# Patient Record
Sex: Male | Born: 1990 | Race: Black or African American | Hispanic: No | Marital: Single | Smoking: Never smoker
Health system: Southern US, Community
[De-identification: ages and names within clinical notes are randomized; demographics above are authoritative.]

## PROBLEM LIST (undated history)

## (undated) DIAGNOSIS — L309 Dermatitis, unspecified: Secondary | ICD-10-CM

## (undated) HISTORY — PX: SINUS SURGERY WITH INSTATRAK: SHX5215

---

## 2014-09-27 ENCOUNTER — Encounter (HOSPITAL_COMMUNITY): Payer: Self-pay | Admitting: Emergency Medicine

## 2014-09-27 ENCOUNTER — Emergency Department (INDEPENDENT_AMBULATORY_CARE_PROVIDER_SITE_OTHER)
Admission: EM | Admit: 2014-09-27 | Discharge: 2014-09-27 | Disposition: A | Discharge status: Home / Self Care | Payer: BLUE CROSS/BLUE SHIELD | Source: Home / Self Care | Attending: Family Medicine | Admitting: Family Medicine

## 2014-09-27 ENCOUNTER — Emergency Department (INDEPENDENT_AMBULATORY_CARE_PROVIDER_SITE_OTHER): Payer: BLUE CROSS/BLUE SHIELD

## 2014-09-27 DIAGNOSIS — S62609A Fracture of unspecified phalanx of unspecified finger, initial encounter for closed fracture: Secondary | ICD-10-CM

## 2014-09-27 MED ORDER — HYDROCODONE-ACETAMINOPHEN 5-325 MG PO TABS: 1.0000 | ORAL_TABLET | Freq: Four times a day (QID) | ORAL | Status: DC | PRN

## 2014-09-27 NOTE — ED Notes (Signed)
Pt states that he was playing basketball and he finger was bent back. Pt index finger is swollen.

## 2014-09-27 NOTE — ED Provider Notes (Signed)
CSN: [id number]     Arrival date & time 09/27/14  1452 History   First MD Initiated Contact with Patient 09/27/14 1547     Chief Complaint  Patient presents with  . Finger Injury   (Consider location/radiation/quality/duration/timing/severity/associated sxs/prior Treatment) Patient is a 24 y.o. male presenting with hand pain. The history is provided by the patient.  Hand Pain This is a new problem. The current episode started 3 to 5 hours ago (injured playing basketball 3 wks ago, hyperextended again today playing ball .).    History reviewed. No pertinent past medical history. History reviewed. No pertinent past surgical history. History reviewed. No pertinent family history. History  Substance Use Topics  . Smoking status: Never Smoker   . Smokeless tobacco: Not on file  . Alcohol Use: Yes     Comment: pt states once a week    Review of Systems  Musculoskeletal: Positive for joint swelling.  Skin: Negative.     Allergies  Review of patient's allergies indicates no known allergies.  Home Medications   Prior to Admission medications   Medication Sig Start Date End Date Taking? Authorizing Provider  HYDROcodone-acetaminophen (NORCO/VICODIN) 5-325 MG per tablet Take 1 tablet by mouth every 6 (six) hours as needed for moderate pain or severe pain. 09/27/14   Linna HoffJames D Katharine Rochefort, MD   BP 141/85 mmHg  Pulse 81  Temp(Src) 98.4 F (36.9 C) (Oral)  Resp 18  Ht 6' (1.829 m)  Wt 180 lb (81.647 kg)  BMI 24.41 kg/m2  SpO2 100% Physical Exam  Constitutional: He is oriented to person, place, and time. He appears well-developed and well-nourished.  Musculoskeletal: He exhibits tenderness.       Hands: Neurological: He is alert and oriented to person, place, and time.  Skin: Skin is warm and dry.  Nursing note and vitals reviewed.   ED Course  Procedures (including critical care time) Labs Review Labs Reviewed - No data to display  Imaging Review Dg Finger Index  Right  09/27/2014   CLINICAL DATA:  Injury of the right index finger while playing basketball today.  EXAM: RIGHT INDEX FINGER 2+V  COMPARISON:  None.  FINDINGS: The patient has sustained a mildly impacted fracture of the base of the proximal phalanx of the index finger. In addition there is an obliquely oriented intra-articular fracture through the radial aspect of the distal portion of the proximal phalanx. A small avulsion fracture fragment is noted along the ulnar aspect of the PIP joint space. The middle and distal phalanges appear intact. The joint spaces are preserved. The observed portions of the first metacarpal are intact.  IMPRESSION: The patient has sustained fractures of the proximal and distal portions of the proximal phalanx of the index finger. The distal fracture is intra-articular.   Electronically Signed   By: David  SwazilandJordan   On: 09/27/2014 16:37     MDM   1. Finger fracture, right, closed, initial encounter    Discussed with dr Melvyn Novasortmann, plans as advised.   Linna HoffJames D Shonika Kolasinski, MD 09/28/14 [phone]

## 2014-09-27 NOTE — Discharge Instructions (Signed)
Leave splinted until seen by orthopedist on thurs.

## 2014-09-29 ENCOUNTER — Encounter (HOSPITAL_COMMUNITY): Payer: Self-pay | Admitting: *Deleted

## 2014-09-29 NOTE — Progress Notes (Signed)
Called Dr. Glenna Durandrtmann's office for pre-op orders. Left message on his schedulers voicemail.

## 2014-09-30 ENCOUNTER — Ambulatory Visit (HOSPITAL_COMMUNITY): Payer: BLUE CROSS/BLUE SHIELD | Admitting: Anesthesiology

## 2014-09-30 ENCOUNTER — Ambulatory Visit (HOSPITAL_COMMUNITY)
Admission: RE | Admit: 2014-09-30 | Discharge: 2014-09-30 | Disposition: A | Discharge status: Home / Self Care | Payer: BLUE CROSS/BLUE SHIELD | Source: Ambulatory Visit | Attending: Orthopedic Surgery | Admitting: Orthopedic Surgery

## 2014-09-30 ENCOUNTER — Encounter (HOSPITAL_COMMUNITY)
Admission: RE | Disposition: A | Discharge status: Home / Self Care | Payer: Self-pay | Source: Ambulatory Visit | Attending: Orthopedic Surgery

## 2014-09-30 ENCOUNTER — Encounter (HOSPITAL_COMMUNITY): Payer: Self-pay | Admitting: *Deleted

## 2014-09-30 DIAGNOSIS — S62640A Nondisplaced fracture of proximal phalanx of right index finger, initial encounter for closed fracture: Secondary | ICD-10-CM | POA: Insufficient documentation

## 2014-09-30 DIAGNOSIS — X58XXXA Exposure to other specified factors, initial encounter: Secondary | ICD-10-CM | POA: Insufficient documentation

## 2014-09-30 HISTORY — PX: OPEN REDUCTION INTERNAL FIXATION (ORIF) FINGER WITH RADIAL BONE GRAFT: SHX5666

## 2014-09-30 HISTORY — DX: Dermatitis, unspecified: L30.9

## 2014-09-30 SURGERY — OPEN REDUCTION INTERNAL FIXATION (ORIF) FINGER WITH RADIAL BONE GRAFT
Anesthesia: General | Site: Hand | Laterality: Right

## 2014-09-30 MED ORDER — CHLORHEXIDINE GLUCONATE 4 % EX LIQD
60.0000 mL | Freq: Once | CUTANEOUS | Status: DC
  Filled 2014-09-30: qty 60

## 2014-09-30 MED ORDER — BUPIVACAINE HCL 0.25 % IJ SOLN
INTRAMUSCULAR | Status: DC | PRN
  Administered 2014-09-30: 5 mL

## 2014-09-30 MED ORDER — PROMETHAZINE HCL 25 MG/ML IJ SOLN: 6.2500 mg | INTRAMUSCULAR | Status: DC | PRN

## 2014-09-30 MED ORDER — 0.9 % SODIUM CHLORIDE (POUR BTL) OPTIME
TOPICAL | Status: DC | PRN
  Administered 2014-09-30: 1000 mL

## 2014-09-30 MED ORDER — FENTANYL CITRATE 0.05 MG/ML IJ SOLN
INTRAMUSCULAR | Status: AC
  Filled 2014-09-30: qty 5

## 2014-09-30 MED ORDER — LIDOCAINE HCL (CARDIAC) 10 MG/ML IV SOLN
INTRAVENOUS | Status: DC | PRN
  Administered 2014-09-30: 60 mg via INTRAVENOUS

## 2014-09-30 MED ORDER — PROPOFOL 10 MG/ML IV BOLUS
INTRAVENOUS | Status: DC | PRN
  Administered 2014-09-30: 200 mg via INTRAVENOUS

## 2014-09-30 MED ORDER — MEPERIDINE HCL 25 MG/ML IJ SOLN: 6.2500 mg | INTRAMUSCULAR | Status: DC | PRN

## 2014-09-30 MED ORDER — ONDANSETRON HCL 4 MG/2ML IJ SOLN
INTRAMUSCULAR | Status: DC | PRN
  Administered 2014-09-30: 4 mg via INTRAVENOUS

## 2014-09-30 MED ORDER — BUPIVACAINE HCL (PF) 0.25 % IJ SOLN
INTRAMUSCULAR | Status: AC
  Filled 2014-09-30: qty 30

## 2014-09-30 MED ORDER — MIDAZOLAM HCL 5 MG/5ML IJ SOLN
INTRAMUSCULAR | Status: DC | PRN
  Administered 2014-09-30: 2 mg via INTRAVENOUS

## 2014-09-30 MED ORDER — FENTANYL CITRATE 0.05 MG/ML IJ SOLN
INTRAMUSCULAR | Status: DC | PRN
  Administered 2014-09-30: 100 ug via INTRAVENOUS
  Administered 2014-09-30 (×2): 50 ug via INTRAVENOUS
  Administered 2014-09-30 (×2): 25 ug via INTRAVENOUS

## 2014-09-30 MED ORDER — CEFAZOLIN SODIUM-DEXTROSE 2-3 GM-% IV SOLR
INTRAVENOUS | Status: AC
  Filled 2014-09-30: qty 50

## 2014-09-30 MED ORDER — LACTATED RINGERS IV SOLN
INTRAVENOUS | Status: DC | PRN
  Administered 2014-09-30: 16:00:00 via INTRAVENOUS

## 2014-09-30 MED ORDER — MIDAZOLAM HCL 2 MG/2ML IJ SOLN
INTRAMUSCULAR | Status: AC
  Filled 2014-09-30: qty 2

## 2014-09-30 MED ORDER — DEXAMETHASONE SODIUM PHOSPHATE 4 MG/ML IJ SOLN
INTRAMUSCULAR | Status: DC | PRN
  Administered 2014-09-30: 4 mg via INTRAVENOUS

## 2014-09-30 MED ORDER — ONDANSETRON HCL 4 MG/2ML IJ SOLN
INTRAMUSCULAR | Status: AC
  Filled 2014-09-30: qty 2

## 2014-09-30 MED ORDER — FENTANYL CITRATE 0.05 MG/ML IJ SOLN: 25.0000 ug | INTRAMUSCULAR | Status: DC | PRN

## 2014-09-30 MED ORDER — OXYCODONE-ACETAMINOPHEN 5-325 MG PO TABS: 1.0000 | ORAL_TABLET | ORAL | Status: DC | PRN

## 2014-09-30 MED ORDER — CEFAZOLIN SODIUM-DEXTROSE 2-3 GM-% IV SOLR
2.0000 g | INTRAVENOUS | Status: AC
  Administered 2014-09-30: 2 g via INTRAVENOUS

## 2014-09-30 MED ORDER — DOCUSATE SODIUM 100 MG PO CAPS: 100.0000 mg | ORAL_CAPSULE | Freq: Two times a day (BID) | ORAL | Status: DC

## 2014-09-30 SURGICAL SUPPLY — 39 items
0.28 kwire ×6 IMPLANT
BANDAGE ELASTIC 3 VELCRO ST LF (GAUZE/BANDAGES/DRESSINGS) ×3 IMPLANT
BANDAGE ELASTIC 4 VELCRO ST LF (GAUZE/BANDAGES/DRESSINGS) ×3 IMPLANT
BNDG COHESIVE 1X5 TAN STRL LF (GAUZE/BANDAGES/DRESSINGS) ×3 IMPLANT
BNDG CONFORM 2 STRL LF (GAUZE/BANDAGES/DRESSINGS) ×3 IMPLANT
BNDG ESMARK 4X9 LF (GAUZE/BANDAGES/DRESSINGS) ×3 IMPLANT
BNDG GAUZE ELAST 4 BULKY (GAUZE/BANDAGES/DRESSINGS) ×3 IMPLANT
CATH URET WHISTLE 8FR 331008 (CATHETERS) ×3 IMPLANT
CORDS BIPOLAR (ELECTRODE) ×3 IMPLANT
COVER SURGICAL LIGHT HANDLE (MISCELLANEOUS) ×3 IMPLANT
CUFF TOURNIQUET SINGLE 18IN (TOURNIQUET CUFF) ×3 IMPLANT
DRAPE OEC MINIVIEW 54X84 (DRAPES) ×6 IMPLANT
DRSG ADAPTIC 3X8 NADH LF (GAUZE/BANDAGES/DRESSINGS) ×3 IMPLANT
GAUZE SPONGE 4X4 12PLY STRL (GAUZE/BANDAGES/DRESSINGS) ×3 IMPLANT
GAUZE SPONGE 4X4 16PLY XRAY LF (GAUZE/BANDAGES/DRESSINGS) ×3 IMPLANT
GLOVE BIOGEL PI IND STRL 8.5 (GLOVE) ×1 IMPLANT
GLOVE BIOGEL PI INDICATOR 8.5 (GLOVE) ×2
GLOVE SURG ORTHO 8.0 STRL STRW (GLOVE) ×3 IMPLANT
GOWN STRL REUS W/ TWL LRG LVL3 (GOWN DISPOSABLE) ×1 IMPLANT
GOWN STRL REUS W/ TWL XL LVL3 (GOWN DISPOSABLE) ×1 IMPLANT
GOWN STRL REUS W/TWL LRG LVL3 (GOWN DISPOSABLE) ×2
GOWN STRL REUS W/TWL XL LVL3 (GOWN DISPOSABLE) ×2
KIT BASIN OR (CUSTOM PROCEDURE TRAY) ×3 IMPLANT
KIT ROOM TURNOVER OR (KITS) ×3 IMPLANT
NEEDLE HYPO 25X1 1.5 SAFETY (NEEDLE) ×3 IMPLANT
NS IRRIG 1000ML POUR BTL (IV SOLUTION) ×3 IMPLANT
PACK ORTHO EXTREMITY (CUSTOM PROCEDURE TRAY) ×3 IMPLANT
PAD ARMBOARD 7.5X6 YLW CONV (MISCELLANEOUS) ×3 IMPLANT
PAD CAST 4YDX4 CTTN HI CHSV (CAST SUPPLIES) ×1 IMPLANT
PADDING CAST COTTON 4X4 STRL (CAST SUPPLIES) ×2
SOAP 2 % CHG 4 OZ (WOUND CARE) ×3 IMPLANT
SPLINT FINGER (SOFTGOODS) ×3 IMPLANT
SUT MNCRL AB 4-0 PS2 18 (SUTURE) ×3 IMPLANT
SYR CONTROL 10ML LL (SYRINGE) ×3 IMPLANT
TOWEL OR 17X24 6PK STRL BLUE (TOWEL DISPOSABLE) ×3 IMPLANT
TOWEL OR 17X26 10 PK STRL BLUE (TOWEL DISPOSABLE) ×3 IMPLANT
TUBE CONNECTING 12'X1/4 (SUCTIONS) ×1
TUBE CONNECTING 12X1/4 (SUCTIONS) ×2 IMPLANT
YANKAUER SUCT BULB TIP NO VENT (SUCTIONS) ×3 IMPLANT

## 2014-09-30 NOTE — Anesthesia Procedure Notes (Signed)
Procedure Name: LMA Insertion Date/Time: 09/30/2014 4:05 PM Performed by: Elon AlasLEE, Marivel Mcclarty BROWN Pre-anesthesia Checklist: Patient identified, Timeout performed, Emergency Drugs available, Suction available and Patient being monitored Patient Re-evaluated:Patient Re-evaluated prior to inductionOxygen Delivery Method: Circle system utilized Preoxygenation: Pre-oxygenation with 100% oxygen Intubation Type: IV induction LMA: LMA inserted LMA Size: 5.0 Number of attempts: 1 Placement Confirmation: positive ETCO2 and breath sounds checked- equal and bilateral Tube secured with: Tape Dental Injury: Teeth and Oropharynx as per pre-operative assessment

## 2014-09-30 NOTE — Discharge Instructions (Signed)
KEEP BANDAGE CLEAN AND DRY CALL OFFICE FOR F/U APPT [phone] DR. ORTMANN CELL [phone] KEEP HAND ELEVATED ABOVE HEART OK TO APPLY ICE TO OPERATIVE AREA CONTACT OFFICE IF ANY WORSENING PAIN OR CONCERNS.  General Anesthesia, Care After Refer to this sheet in the next few weeks. These instructions provide you with information on caring for yourself after your procedure. Your health care provider may also give you more specific instructions. Your treatment has been planned according to current medical practices, but problems sometimes occur. Call your health care provider if you have any problems or questions after your procedure. WHAT TO EXPECT AFTER THE PROCEDURE After the procedure, it is typical to experience:  Sleepiness.  Nausea and vomiting. HOME CARE INSTRUCTIONS  For the first 24 hours after general anesthesia:  Have a responsible person with you.  Do not drive a car. If you are alone, do not take public transportation.  Do not drink alcohol.  Do not take medicine that has not been prescribed by your health care provider.  Do not sign important papers or make important decisions.  You may resume a normal diet and activities as directed by your health care provider.  Change bandages (dressings) as directed.  If you have questions or problems that seem related to general anesthesia, call the hospital and ask for the anesthetist or anesthesiologist on call. SEEK MEDICAL CARE IF:  You have nausea and vomiting that continue the day after anesthesia.  You develop a rash. SEEK IMMEDIATE MEDICAL CARE IF:   You have difficulty breathing.  You have chest pain.  You have any allergic problems. Document Released: 11/05/2000 Document Revised: 08/04/2013 Document Reviewed: 02/12/2013 [facility] Patient Information 2015 [place], [place]. This information is not intended to replace advice given to you by your health care provider. Make sure you discuss any questions you have with  your health care provider.

## 2014-09-30 NOTE — H&P (Signed)
Javier Butler is an 24 y.o. male.   Chief Complaint: RIGHT INDEX FINGER FRACTURE HPI: PT REFERRED FROM URGENT CARE PT SEEN/EVALUATED IN OFFICE PT WITH DISPLACED CONDYLAR FRACTURE TO RIGHT INDEX FINGER PT HERE FOR SURGERY NO PRIOR SURGERY TO RIGHT INDEX FINGER  Past Medical History  Diagnosis Date  . Eczema     Past Surgical History  Procedure Laterality Date  . Sinus surgery with instatrak      History reviewed. No pertinent family history. Social History:  reports that he has never smoked. He has never used smokeless tobacco. He reports that he drinks alcohol. He reports that he does not use illicit drugs.  Allergies: No Known Allergies  Medications Prior to Admission  Medication Sig Dispense Refill  . halobetasol (ULTRAVATE) 0.05 % cream Apply topically 2 (two) times daily as needed.    Marland Kitchen. HYDROcodone-acetaminophen (NORCO/VICODIN) 5-325 MG per tablet Take 1 tablet by mouth every 6 (six) hours as needed for moderate pain or severe pain. 12 tablet 0    No results found for this or any previous visit (from the past 48 hour(s)). No results found.  ROSNO RECENT ILLNESSES OR HOSPITALIZATIONS  Blood pressure 143/73, pulse 58, temperature 99.4 F (37.4 C), temperature source Oral, resp. rate 18, height 6' (1.829 m), weight 84.823 kg (187 lb), SpO2 100 %. Physical Exam  General Appearance:  Alert, cooperative, no distress, appears stated age  Head:  Normocephalic, without obvious abnormality, atraumatic  Eyes:  Pupils equal, conjunctiva/corneas clear,         Throat: Lips, mucosa, and tongue normal; teeth and gums normal  Neck: No visible masses     Lungs:   respirations unlabored  Chest Wall:  No tenderness or deformity  Heart:  Regular rate and rhythm,  Abdomen:   Soft, non-tender,         Extremities: RIGHT HAND: FINGER STIFF SWOLLEN, FINGER WARM WELL PERFUSED GOOD MOBILITY TO LONG/RING/SMALL SENS TO LT PRESENT DISTALLY  Pulses: 2+ and symmetric  Skin: Skin  color, texture, turgor normal, no rashes or lesions     Neurologic: Normal    Assessment/Plan RIGHT INDEX FINGER DISPLACED CONDYLAR FRACTURE, PROXIMAL PHALANX  RIGHT INDEX FINGER OPEN REDUCTION AND INTERNAL FIXATION AND REPAIR AS INDICATED  R/B/A DISCUSSED WITH PT IN OFFICE.  PT VOICED UNDERSTANDING OF PLAN CONSENT SIGNED DAY OF SURGERY PT SEEN AND EXAMINED PRIOR TO OPERATIVE PROCEDURE/DAY OF SURGERY SITE MARKED. QUESTIONS ANSWERED WILL GO HOME FOLLOWING SURGERY  WE ARE PLANNING SURGERY FOR YOUR UPPER EXTREMITY. THE RISKS AND BENEFITS OF SURGERY INCLUDE BUT NOT LIMITED TO BLEEDING INFECTION, DAMAGE TO NEARBY NERVES ARTERIES TENDONS, FAILURE OF SURGERY TO ACCOMPLISH ITS INTENDED GOALS, PERSISTENT SYMPTOMS AND NEED FOR FURTHER SURGICAL INTERVENTION. WITH THIS IN MIND WE WILL PROCEED. I HAVE DISCUSSED WITH THE PATIENT THE PRE AND POSTOPERATIVE REGIMEN AND THE DOS AND DON'TS. PT VOICED UNDERSTANDING AND INFORMED CONSENT SIGNED.  Sharma CovertTMANN,Senya Hinzman W 09/30/2014, 2:02 PM

## 2014-09-30 NOTE — Brief Op Note (Signed)
09/30/2014  2:05 PM  PATIENT:  Casimiro NeedleMichael Mensing  24 y.o. male  PRE-OPERATIVE DIAGNOSIS:  RIGHT INDEX FINGER PROXIMANL PHALUX FRACTURE  POST-OPERATIVE DIAGNOSIS:  * No post-op diagnosis entered *  PROCEDURE:  Procedure(s): OPEN REDUCTION INTERNAL FIXATION (ORIF) RIGHT FINGERINDEX  (Right)  SURGEON:  Surgeon(s) and Role:    * Sharma CovertFred W Gwendalyn Mcgonagle, MD - Primary  PHYSICIAN ASSISTANT:   ASSISTANTS: none   ANESTHESIA:   general  EBL:     BLOOD ADMINISTERED:none  DRAINS: none   LOCAL MEDICATIONS USED:  MARCAINE     SPECIMEN:  No Specimen  DISPOSITION OF SPECIMEN:  N/A  COUNTS:  YES  TOURNIQUET:  * No tourniquets in log *  DICTATION: .[id number]  PLAN OF CARE: Discharge to home after PACU  PATIENT DISPOSITION:  PACU - hemodynamically stable.   Delay start of Pharmacological VTE agent (>24hrs) due to surgical blood loss or risk of bleeding: yes

## 2014-09-30 NOTE — Anesthesia Preprocedure Evaluation (Addendum)
Anesthesia Evaluation  Patient identified by MRN, date of birth, ID band Patient awake    Reviewed: Allergy & Precautions, NPO status , Patient's Chart, lab work & pertinent test results, reviewed documented beta blocker date and time   Airway Mallampati: II   Neck ROM: Full    Dental  (+) Teeth Intact, Dental Advisory Given   Pulmonary neg pulmonary ROS,  breath sounds clear to auscultation        Cardiovascular negative cardio ROS  Rhythm:Regular     Neuro/Psych negative neurological ROS     GI/Hepatic negative GI ROS, Neg liver ROS,   Endo/Other  negative endocrine ROS  Renal/GU negative Renal ROS     Musculoskeletal negative musculoskeletal ROS (+)   Abdominal (+)  Abdomen: soft.    Peds  Hematology negative hematology ROS (+)   Anesthesia Other Findings   Reproductive/Obstetrics                            Anesthesia Physical Anesthesia Plan  ASA: I  Anesthesia Plan: General   Post-op Pain Management:    Induction:   Airway Management Planned: LMA  Additional Equipment:   Intra-op Plan:   Post-operative Plan: Extubation in OR  Informed Consent: I have reviewed the patients History and Physical, chart, labs and discussed the procedure including the risks, benefits and alternatives for the proposed anesthesia with the patient or authorized representative who has indicated his/her understanding and acceptance.     Plan Discussed with:   Anesthesia Plan Comments:         Anesthesia Quick Evaluation

## 2014-09-30 NOTE — Transfer of Care (Signed)
Immediate Anesthesia Transfer of Care Note  Patient: Javier Butler  Procedure(s) Performed: Procedure(s): OPEN REDUCTION INTERNAL FIXATION (ORIF) RIGHT FINGERINDEX  (Right)  Patient Location: PACU  Anesthesia Type:General  Level of Consciousness: awake, alert , oriented and patient cooperative  Airway & Oxygen Therapy: Patient Spontanous Breathing and Patient connected to nasal cannula oxygen  Post-op Assessment: Report given to RN, Post -op Vital signs reviewed and stable, Patient moving all extremities and Patient moving all extremities X 4  Post vital signs: Reviewed and stable  Last Vitals:  Filed Vitals:   09/30/14 1312  BP: 143/73  Pulse: 58  Temp: 37.4 C  Resp: 18    Complications: No apparent anesthesia complications

## 2014-09-30 NOTE — Anesthesia Postprocedure Evaluation (Signed)
  Anesthesia Post-op Note  Patient: Javier KannerMichael Butler  Procedure(s) Performed: Procedure(s): OPEN REDUCTION INTERNAL FIXATION (ORIF) RIGHT FINGERINDEX  (Right)  Patient Location: PACU  Anesthesia Type:General  Level of Consciousness: awake, alert  and oriented  Airway and Oxygen Therapy: Patient Spontanous Breathing  Post-op Pain: mild  Post-op Assessment: Post-op Vital signs reviewed, Patient's Cardiovascular Status Stable, Respiratory Function Stable, Patent Airway, No signs of Nausea or vomiting and Pain level controlled  Post-op Vital Signs: stable  Last Vitals:  Filed Vitals:   09/30/14 1830  BP: 126/70  Pulse: 55  Temp:   Resp: 9    Complications: No apparent anesthesia complications

## 2014-10-01 NOTE — Op Note (Signed)
NAMCarmie Butler:  Fuentes, Beth        ACCOUNT NO.:  [id number]  MEDICAL RECORD NO.:  [id number]  LOCATION:  [facility]                         FACILITY:  [facility]  PHYSICIAN:  Madelynn DoneFred W Chantelle Verdi IV, MD  DATE OF BIRTH:  1991/03/31  DATE OF PROCEDURE:  09/30/2014 DATE OF DISCHARGE:  09/30/2014                              OPERATIVE REPORT   PREOPERATIVE DIAGNOSES: 1. Right index finger proximal phalanx fracture involving articular     surface of the interphalangeal joint. 2. Right index finger proximal phalanx fracture involving the shaft     nondisplaced.  POSTOPERATIVE DIAGNOSES: 1. Right index finger proximal phalanx fracture involving articular     surface of the interphalangeal joint. 2. Right index finger proximal phalanx fracture involving the shaft     nondisplaced.  ATTENDING PHYSICIAN:  Madelynn DoneFred W. Deitrich Steve IV, M.D. who scrubbed and present for the entire procedure.  ASSISTANT SURGEON:  None.  ANESTHESIA:  General via LMA.  SURGICAL PROCEDURE: 1. Open treatment of right wrist, right index finger intra-articular     proximal phalangeal fracture involving the interphalangeal joint. 2. Closed treatment of right index finger proximal phalangeal shaft     fracture without internal fixation. 3. Radiographs 3 views right index finger.  SURGICAL IMPLANTS:  A 20.028 K-wires.  SURGICAL INDICATION:  Mr. Javier Butler is a right-hand-dominant gentleman, who sustained a closed injury to his right index finger proximal phalanx on 2 separate occasions.  The patient had close phalanges shaft fracture which is nondisplaced. However, he did have the intra-articular fracture involving the interphalangeal joint approximately.  The patient was seen and evaluated given the intra- articular fractures recommended to undergo the above procedure.  Risks, benefits, and alternatives were discussed in detail with the patient and signed informed consent was obtained.  Risks include, but not limited to bleeding,  infection, damage to nearby nerves, arteries, or tendons, loss of motion of wrist and digits, incomplete relief of symptoms and need nonunion, malunion, malrotation, and need for further surgical intervention.  DESCRIPTION OF PROCEDURE:  The patient was properly identified in the preoperative holding area and marked with a permanent marker made on the right index finger to indicate the correct operative site.  The patient was then brought back to the operating room, placed supine on anesthesia room table.  General anesthesia was administered.  The patient received preoperative antibiotics prior to skin incision.  A well-padded tourniquet was placed on the right brachium and sealed with 1000-drape. The right upper extremity was then prepped and draped in normal sterile fashion.  Time-out was called, the correct side was identified, and procedure then begun.  Attention was then turned to the index finger or curvilinear incision made directly over the proximal interphalangeal joint.  Limb elevated and tourniquet insufflated.  Dissection was carried down through the skin and subcutaneous tissue.  The tensor interval was then split along the radial border between the lateral band extensor mechanism.  This was carefully elevated.  Periosteal layer was then elevated and the joint was then exposed.  The fracture was then taken down with small __________ and a small instrumentation in order to free the small fragment, it did appear subacute fracture.  Careful dissection was then carried out preserving the collateral  ligament. Once this was carried out, an open reduction was then performed reducing very small fracture.  Once this was then reduced.  Reduction clamp was then placed and 20.028 K-wire was then placed.  Based on this, the purchase and the placement of the K-wires was signed with the K-wire fixation not exchanged now for screws given the very small nature of the fracture.  The wound was  then thoroughly irrigated.  Final radiographs were then obtained.  The phalangeal shaft was then examined and noted to be nondisplaced radiographically.  Thorough wound irrigation done throughout.  The extensor interval was then closed with 4-0 Ethibond suture.  The skin was then closed with simple Prolene sutures.  The K- wires then cut and then bent left beneath the skin.  Adaptic dressing and sterile compressive bandage was applied.  A 7 mL 0.25% Marcaine flexor sheath block was then performed.  The patient tolerated this well.  The patient was placed in a finger splint extubated, and taken to recovery room in good condition.  Intraoperative radiographs 3 views of the finger do show the K-wire fixation in place with good alignment.  INTRAOPERATIVE FINDINGS:  The patient did have a comminuted segment central cartilaginous defect within the joint as well as another cartilaginous injury along the ulnar condyles, so multiple cartilaginous injuries to the joint.  My prognosis would be fair, given the multiple cartilaginous injuries and the cartilaginous defect centrally within the joint.  POSTPROCEDURE PLAN:  The patient discharged home and seen back in the office in approximately 10 days.  X-rays out of the splint suture removal and then begin a back into splint and total 4 weeks immobilization and pins out of the 4 week mark either in the office or in the outpatient surgical setting depending on the patient's tolerance.     Madelynn Done, MD     FWO/MEDQ  D:  09/30/2014  T:  10/01/2014  Job:  [id number]

## 2014-10-02 ENCOUNTER — Encounter (HOSPITAL_COMMUNITY): Payer: Self-pay | Admitting: Orthopedic Surgery

## 2014-10-07 NOTE — Addendum Note (Signed)
Addendum  created 10/07/14 2237 by Kipp Broodavid Jabriel Vanduyne, MD   Modules edited: Anesthesia Responsible Staff

## 2015-04-13 ENCOUNTER — Emergency Department (INDEPENDENT_AMBULATORY_CARE_PROVIDER_SITE_OTHER)
Admission: EM | Admit: 2015-04-13 | Discharge: 2015-04-13 | Disposition: A | Discharge status: Home / Self Care | Payer: BLUE CROSS/BLUE SHIELD | Source: Home / Self Care | Attending: Family Medicine | Admitting: Family Medicine

## 2015-04-13 ENCOUNTER — Encounter (HOSPITAL_COMMUNITY): Payer: Self-pay | Admitting: Emergency Medicine

## 2015-04-13 DIAGNOSIS — R21 Rash and other nonspecific skin eruption: Secondary | ICD-10-CM | POA: Diagnosis not present

## 2015-04-13 MED ORDER — HALOBETASOL PROPIONATE 0.05 % EX CREA: TOPICAL_CREAM | Freq: Two times a day (BID) | CUTANEOUS | Status: DC | PRN

## 2015-04-13 NOTE — ED Provider Notes (Signed)
CSN: [id number]     Arrival date & time 04/13/15  1523 History   First MD Initiated Contact with Patient 04/13/15 1539     Chief Complaint  Patient presents with  . Rash  . Medication Refill   (Consider location/radiation/quality/duration/timing/severity/associated sxs/prior Treatment) HPI  Rash. Present for several years. Followed by dermatology. Unable to get follow-up with dermatology until late October. Current flare has been going on for 2 weeks. Very itchy. Typically around his waist and back. Single lesion on his back has become open and is now scabbed. Typically relieved by topical steroid ointment that he is unable to get refilled until he sees a dermatologist again. Denies any fevers, nausea, vomiting, joint swelling, neck stiffness, headaches. Problem is constant and getting worse.  Past Medical History  Diagnosis Date  . Eczema    Past Surgical History  Procedure Laterality Date  . Sinus surgery with instatrak    . Open reduction internal fixation (orif) finger with radial bone graft Right 09/30/2014    Procedure: OPEN REDUCTION INTERNAL FIXATION (ORIF) RIGHT [place] ;  Surgeon: Sharma Covert, MD;  Location: [facility] OR;  Service: Orthopedics;  Laterality: Right;   Family History  Problem Relation Age of Onset  . Cancer Neg Hx   . Diabetes Neg Hx   . Heart failure Neg Hx   . Hyperlipidemia Neg Hx   . Hypertension Neg Hx    Social History  Substance Use Topics  . Smoking status: Never Smoker   . Smokeless tobacco: Never Used  . Alcohol Use: Yes     Comment: pt states once a week    Review of Systems Per HPI with all other pertinent systems negative.   Allergies  Review of patient's allergies indicates no known allergies.  Home Medications   Prior to Admission medications   Medication Sig Start Date End Date Taking? Authorizing Provider  docusate sodium (COLACE) 100 MG capsule Take 1 capsule (100 mg total) by mouth 2 (two) times daily. 09/30/14   Bradly Bienenstock,  MD  halobetasol (ULTRAVATE) 0.05 % cream Apply topically 2 (two) times daily as needed. 04/13/15   Ozella Rocks, MD   Meds Ordered and Administered this Visit  Medications - No data to display  BP 98/55 mmHg  Pulse 63  Temp(Src) 97.7 F (36.5 C) (Oral)  Resp 18  SpO2 97% No data found.   Physical Exam Physical Exam  Constitutional: oriented to person, place, and time. appears well-developed and well-nourished. No distress.  HENT:  Head: Normocephalic and atraumatic.  Eyes: EOMI. PERRL.  Neck: Normal range of motion.  Cardiovascular: RRR, no m/r/g, 2+ distal pulses,  Pulmonary/Chest: Effort normal and breath sounds normal. No respiratory distress.  Abdominal: Soft. Bowel sounds are normal. NonTTP, no distension.  Musculoskeletal: Normal range of motion. Non ttp, no effusion.  Neurological: alert and oriented to person, place, and time.  Skin: Diffuse papular rash throughout torso and upper back. Single 1 cm scabbed over lesion on left midback..  Psychiatric: normal mood and affect. behavior is normal. Judgment and thought content normal.   ED Course  Procedures (including critical care time)  Labs Review Labs Reviewed - No data to display  Imaging Review No results found.   Visual Acuity Review  Right Eye Distance:   Left Eye Distance:   Bilateral Distance:    Right Eye Near:   Left Eye Near:    Bilateral Near:         MDM   1. Rash  Patient with extensive history of dermatology workup for rash. Will refill steroid cream. Follow-up with dermatology as previously designated in October. Patient deferring further treatment with oral steroid at this point time.   Ozella Rocks, MD 04/13/15 [phone]

## 2015-04-13 NOTE — Discharge Instructions (Signed)
Your steroid cream was refilled. Please use this as instructed. Please follow-up with dermatology. Please use Benadryl for additional relief.

## 2015-04-13 NOTE — ED Notes (Signed)
Pt here today for a reoccurring rash he has had for two years.  Pt was being followed by a dermatologist but he is unable to get another appointment until October and he is out of his medicated cream.

## 2015-12-15 ENCOUNTER — Encounter (HOSPITAL_COMMUNITY): Payer: Self-pay | Admitting: *Deleted

## 2015-12-15 ENCOUNTER — Ambulatory Visit (HOSPITAL_COMMUNITY)
Admission: EM | Admit: 2015-12-15 | Discharge: 2015-12-15 | Disposition: A | Discharge status: Home / Self Care | Payer: BLUE CROSS/BLUE SHIELD | Attending: Family Medicine | Admitting: Family Medicine

## 2015-12-15 DIAGNOSIS — K648 Other hemorrhoids: Secondary | ICD-10-CM

## 2015-12-15 DIAGNOSIS — K644 Residual hemorrhoidal skin tags: Secondary | ICD-10-CM

## 2015-12-15 MED ORDER — HYDROCORTISONE ACETATE 25 MG RE SUPP: 25.0000 mg | Freq: Two times a day (BID) | RECTAL | Status: DC

## 2015-12-15 NOTE — ED Notes (Signed)
Pt reports  Symptoms  Of       Rectal  Pressure  And  He  Noticed  What    Appears  To  Be  A  hemmoriod        slight itching /  Burning      No  Bleeding          Appears  In no  distress

## 2015-12-15 NOTE — ED Provider Notes (Signed)
CSN: [id number]     Arrival date & time 12/15/15  1258 History   First MD Initiated Contact with Patient 12/15/15 1341     Chief Complaint  Patient presents with  . Rectal Problems   (Consider location/radiation/quality/duration/timing/severity/associated sxs/prior Treatment) Patient is a 25 y.o. male presenting with hematochezia. The history is provided by the patient.  Rectal Bleeding Quality: actually denies bleeding, only swelling. Duration:  2 days Progression:  Unchanged Chronicity:  New Context: hemorrhoids and spontaneously   Similar prior episodes: no   Relieved by:  None tried Worsened by:  Nothing tried Ineffective treatments:  None tried Associated symptoms: no abdominal pain, no fever and no vomiting   Risk factors comment:  Has been doing a lot of lifting over past week, denies gay activity.   Past Medical History  Diagnosis Date  . Eczema    Past Surgical History  Procedure Laterality Date  . Sinus surgery with instatrak    . Open reduction internal fixation (orif) finger with radial bone graft Right 09/30/2014    Procedure: OPEN REDUCTION INTERNAL FIXATION (ORIF) RIGHT [place] ReekFINGERINDEX ;  Surgeon: Sharma CovertFred W Ortmann, MD;  Location: [facility] OR;  Service: Orthopedics;  Laterality: Right;   Family History  Problem Relation Age of Onset  . Cancer Neg Hx   . Diabetes Neg Hx   . Heart failure Neg Hx   . Hyperlipidemia Neg Hx   . Hypertension Neg Hx    Social History  Substance Use Topics  . Smoking status: Never Smoker   . Smokeless tobacco: Never Used  . Alcohol Use: Yes     Comment: pt states once a week    Review of Systems  Constitutional: Negative.  Negative for fever.  Gastrointestinal: Positive for hematochezia and rectal pain. Negative for nausea, vomiting, abdominal pain, diarrhea, constipation and blood in stool.  Genitourinary: Negative.   All other systems reviewed and are negative.   Allergies  Review of patient's allergies indicates no known  allergies.  Home Medications   Prior to Admission medications   Medication Sig Start Date End Date Taking? Authorizing Provider  docusate sodium (COLACE) 100 MG capsule Take 1 capsule (100 mg total) by mouth 2 (two) times daily. 09/30/14   Bradly BienenstockFred Ortmann, MD  halobetasol (ULTRAVATE) 0.05 % cream Apply topically 2 (two) times daily as needed. 04/13/15   Ozella Rocksavid J Merrell, MD  hydrocortisone (ANUSOL-HC) 25 MG suppository Place 1 suppository (25 mg total) rectally 2 (two) times daily. 12/15/15   Linna HoffJames D Antionetta Ator, MD   Meds Ordered and Administered this Visit  Medications - No data to display  BP 139/87 mmHg  Pulse 60  Temp(Src) 98 F (36.7 C) (Oral)  Resp 12  SpO2 98% No data found.   Physical Exam  Constitutional: He is oriented to person, place, and time. He appears well-developed and well-nourished. No distress.  Abdominal: Soft. Bowel sounds are normal.  Genitourinary: Rectal exam shows external hemorrhoid and anal tone abnormal. Rectal exam shows no fissure.     Neurological: He is alert and oriented to person, place, and time.  Skin: Skin is warm and dry.  Nursing note and vitals reviewed.   ED Course  Procedures (including critical care time)  Labs Review Labs Reviewed - No data to display  Imaging Review No results found.   Visual Acuity Review  Right Eye Distance:   Left Eye Distance:   Bilateral Distance:    Right Eye Near:   Left Eye Near:    Bilateral  Near:         MDM   1. External hemorrhoid        Linna Hoff, MD 12/15/15 1351

## 2016-09-29 IMAGING — DX DG FINGER INDEX 2+V*R*
3 series · 3 of 3 positions shown · non-contrast
Comparison: None.

CLINICAL DATA: Injury of the right index finger while playing
basketball today.

EXAM:
RIGHT INDEX FINGER 2+V

[finger ap]
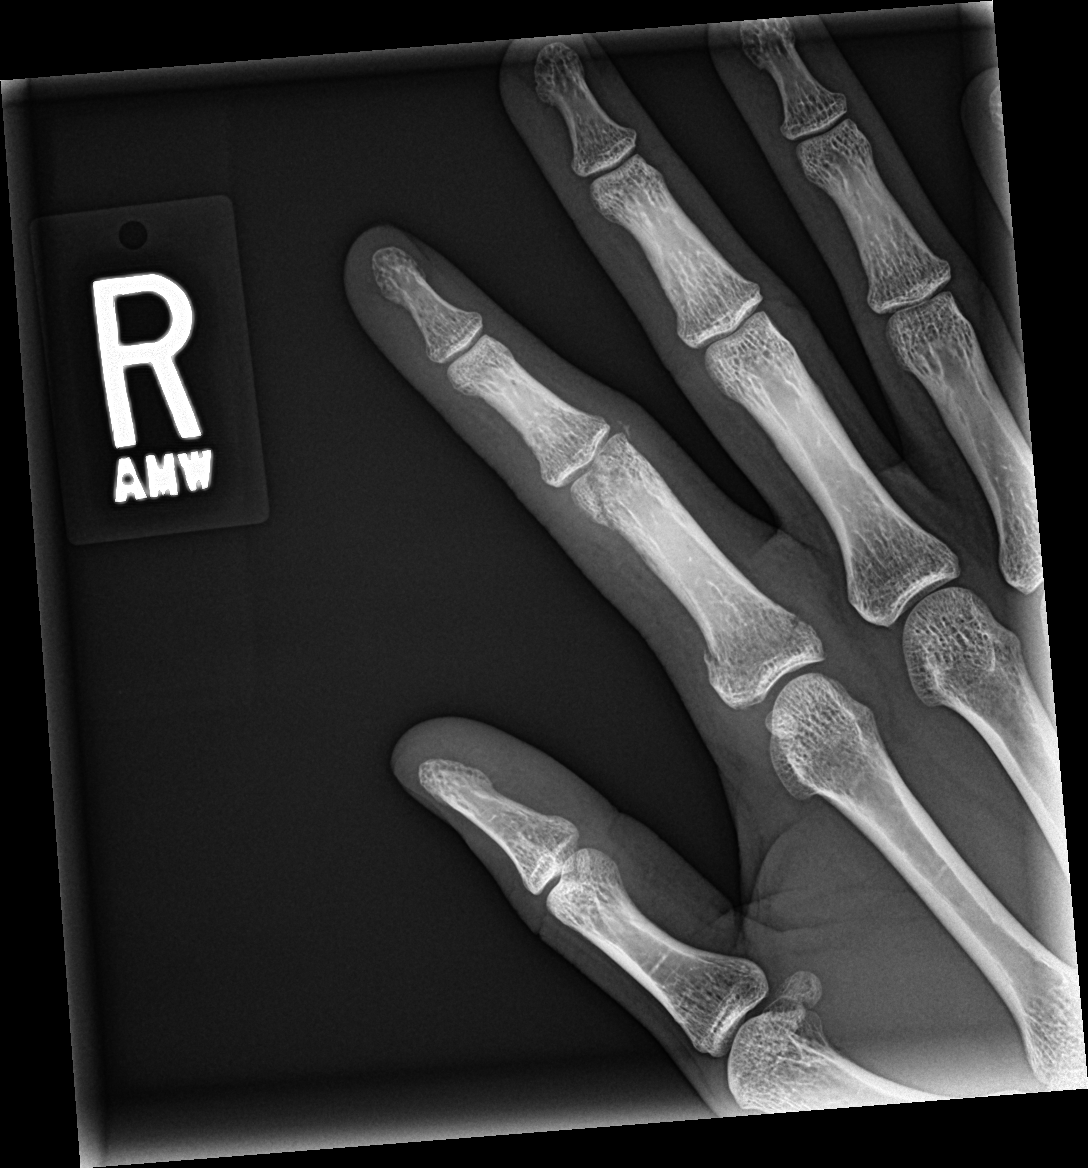

[finger obl]
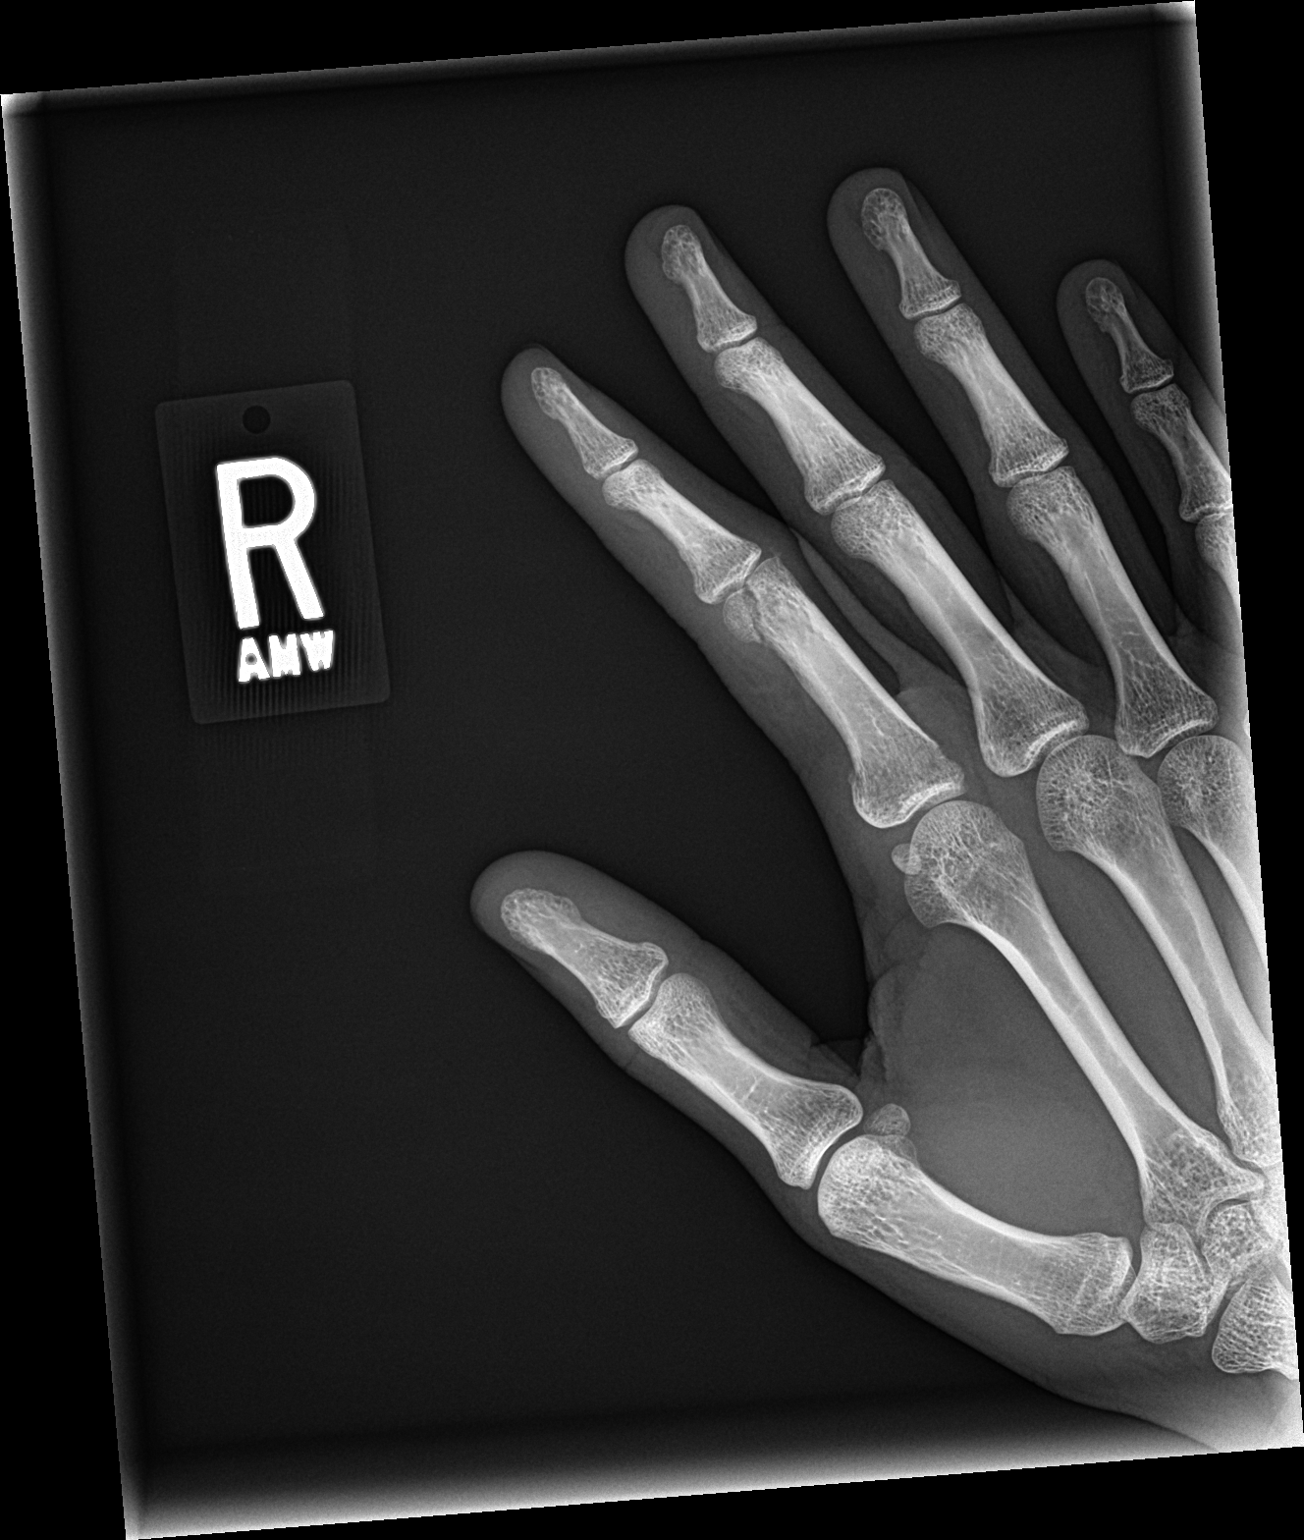

[finger lat]
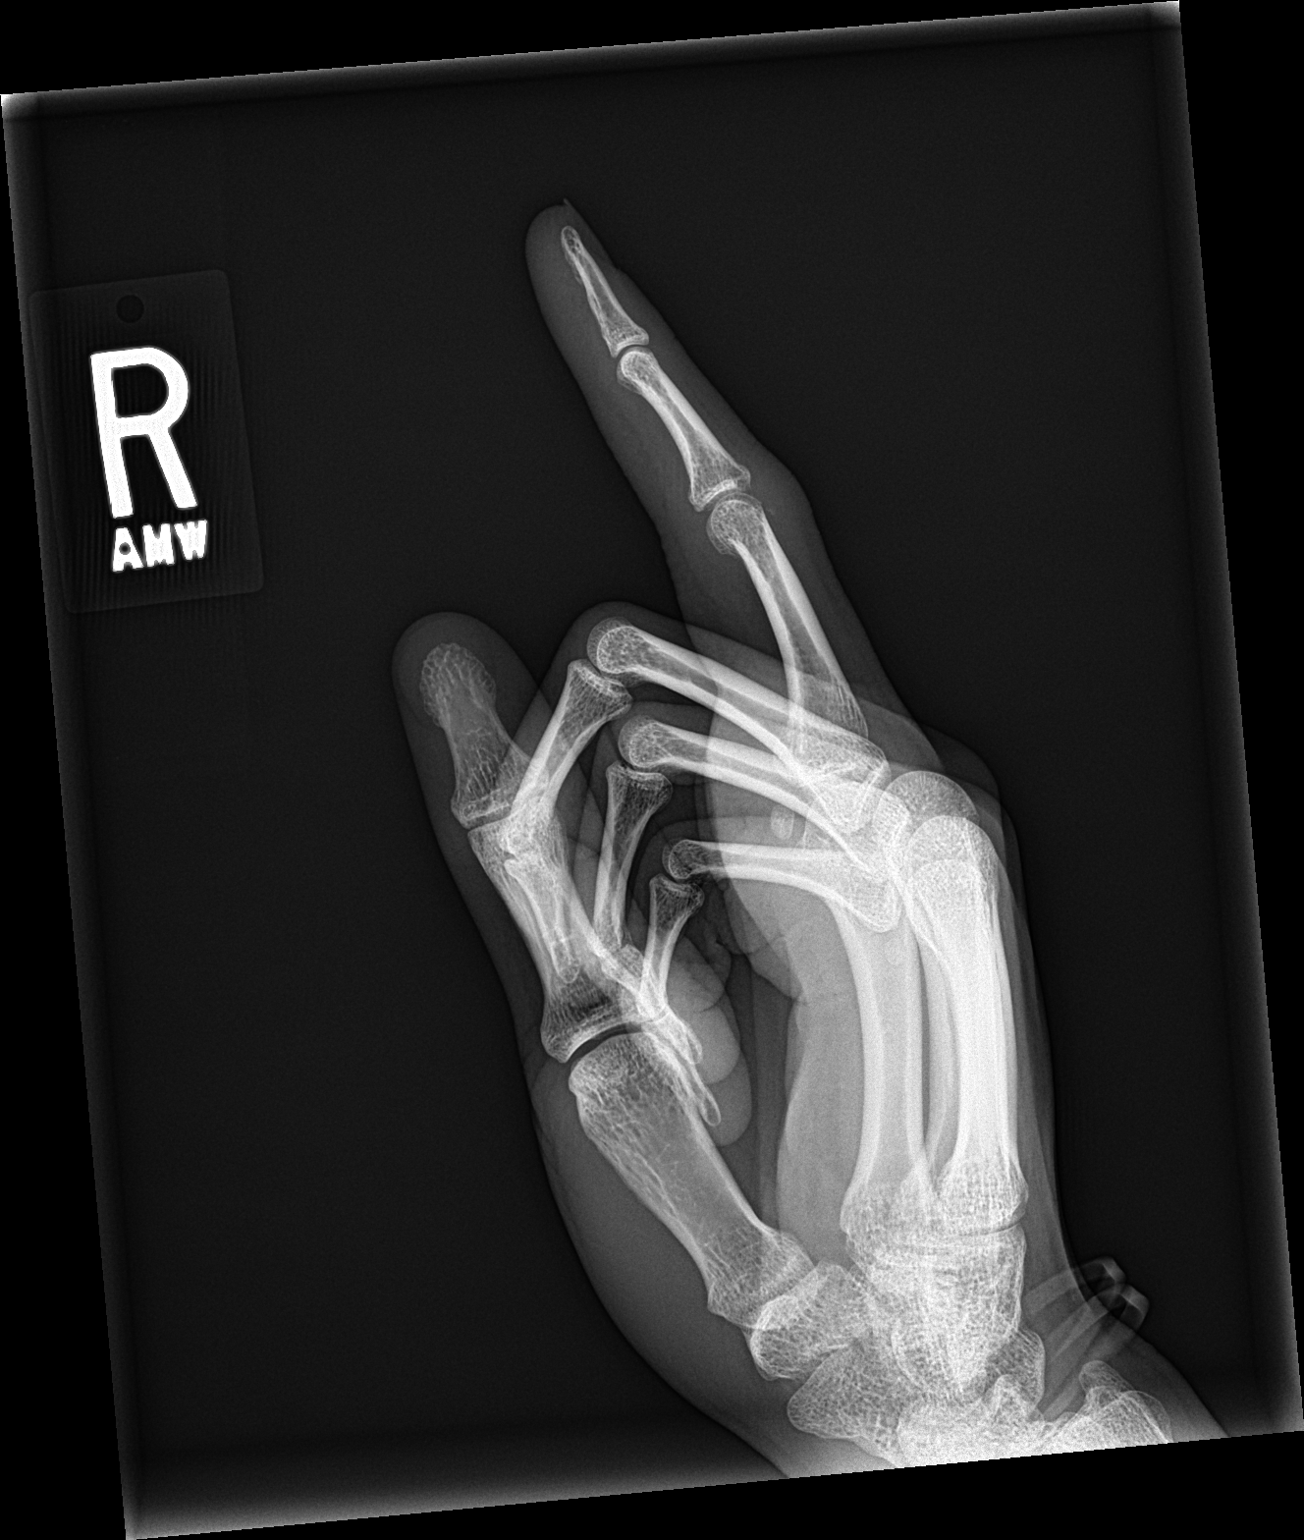

[3 of 3 positions shown; findings below may reference images not displayed]

FINDINGS: The patient has sustained a mildly impacted fracture of the base of
the proximal phalanx of the index finger. In addition there is an
obliquely oriented intra-articular fracture through the radial
aspect of the distal portion of the proximal phalanx. A small
avulsion fracture fragment is noted along the ulnar aspect of the
PIP joint space. The middle and distal phalanges appear intact. The
joint spaces are preserved. The observed portions of the first
metacarpal are intact.
IMPRESSION: The patient has sustained fractures of the proximal and distal
portions of the proximal phalanx of the index finger. The distal
fracture is intra-articular.

## 2017-03-21 ENCOUNTER — Encounter (HOSPITAL_COMMUNITY): Payer: Self-pay | Admitting: Emergency Medicine

## 2017-03-21 ENCOUNTER — Ambulatory Visit (HOSPITAL_COMMUNITY)
Admission: EM | Admit: 2017-03-21 | Discharge: 2017-03-21 | Disposition: A | Discharge status: Home / Self Care | Payer: BLUE CROSS/BLUE SHIELD | Attending: Internal Medicine | Admitting: Internal Medicine

## 2017-03-21 DIAGNOSIS — L309 Dermatitis, unspecified: Secondary | ICD-10-CM

## 2017-03-21 DIAGNOSIS — Z760 Encounter for issue of repeat prescription: Secondary | ICD-10-CM | POA: Diagnosis not present

## 2017-03-21 MED ORDER — HALOBETASOL PROPIONATE 0.05 % EX CREA: TOPICAL_CREAM | Freq: Two times a day (BID) | CUTANEOUS | 0 refills | Status: AC | PRN

## 2017-03-21 NOTE — ED Triage Notes (Signed)
[facility] a [facility] of needing a refill on his halobetasol propionate.

## 2017-03-21 NOTE — Discharge Instructions (Addendum)
Use cream as directed as your dermatologist. Follow up as scheduled with dermatology. If experiencing spreading redness, increased warmth/pain, fever, follow up for reevaluation.

## 2017-03-21 NOTE — ED Provider Notes (Signed)
[facility]    CSN: [id number] Arrival date & time: 03/21/17  1659     History   Chief Complaint Chief Complaint  Patient presents with  . Medication Refill    HPI Carmie KannerMichael Bearman is a 26 y.o. male.   26 year old male with history of eczema comes in for medication refill of ultravate cream. He states he has flare ups and usually the cream will help with his rash. He has ran out of cream for 5 days, and his dermatology appointment is in 1 month. He denies fever, chills, night sweats. Denies trouble breathing, shortness of breath, swelling of the throat. Denies surrounding erythema, increased warmth.       Past Medical History:  Diagnosis Date  . Eczema     There are no active problems to display for this patient.   Past Surgical History:  Procedure Laterality Date  . OPEN REDUCTION INTERNAL FIXATION (ORIF) FINGER WITH RADIAL BONE GRAFT Right 09/30/2014   Procedure: OPEN REDUCTION INTERNAL FIXATION (ORIF) RIGHT [place] ReekFINGERINDEX ;  Surgeon: Sharma CovertFred W Ortmann, MD;  Location: [facility] OR;  Service: Orthopedics;  Laterality: Right;  . SINUS SURGERY WITH INSTATRAK         Home Medications    Prior to Admission medications   Medication Sig Start Date End Date Taking? Authorizing Provider  halobetasol (ULTRAVATE) 0.05 % cream Apply topically 2 (two) times daily as needed. 03/21/17   Belinda FisherYu, Malley Hauter V, PA-C    Family History Family History  Problem Relation Age of Onset  . Cancer Neg Hx   . Diabetes Neg Hx   . Heart failure Neg Hx   . Hyperlipidemia Neg Hx   . Hypertension Neg Hx     Social History Social History  Substance Use Topics  . Smoking status: Never Smoker  . Smokeless tobacco: Never Used  . Alcohol use Yes     Comment: pt states once a week     Allergies   Patient has no known allergies.   Review of Systems Review of Systems  Reason unable to perform ROS: as per HPI.     Physical Exam Triage Vital Signs ED Triage Vitals  Enc Vitals Group   BP 03/21/17 1724 127/67     Pulse Rate 03/21/17 1724 (!) 55     Resp 03/21/17 1724 16     Temp 03/21/17 1724 98 F (36.7 C)     Temp Source 03/21/17 1724 Oral     SpO2 03/21/17 1724 97 %     Weight --      Height --      Head Circumference --      Peak Flow --      Pain Score 03/21/17 1725 0     Pain Loc --      Pain Edu? --      Excl. in GC? --    No data found.   Updated Vital Signs BP 127/67 (BP Location: Right Arm)   Pulse (!) 55   Temp 98 F (36.7 C) (Oral)   Resp 16   SpO2 97%   Visual Acuity Right Eye Distance:   Left Eye Distance:   Bilateral Distance:    Right Eye Near:   Left Eye Near:    Bilateral Near:     Physical Exam  Constitutional: He is oriented to person, place, and time. He appears well-developed and well-nourished. No distress.  HENT:  Head: Normocephalic and atraumatic.  Eyes: Pupils are equal, round, and  reactive to light. Conjunctivae are normal.  Neurological: He is alert and oriented to person, place, and time.  Skin:  Multiple 1-2cm maculopapuler rash with central clearing on the back. No palpable induration, swelling, surrounding erythema, increased warmth     UC Treatments / Results  Labs (all labs ordered are listed, but only abnormal results are displayed) Labs Reviewed - No data to display  EKG  EKG Interpretation None       Radiology No results found.  Procedures Procedures (including critical care time)  Medications Ordered in UC Medications - No data to display   Initial Impression / Assessment and Plan / UC Course  I have reviewed the triage vital signs and the nursing notes.  Pertinent labs & imaging results that were available during my care of the patient were reviewed by me and considered in my medical decision making (see chart for details).    Refilled medication. Discussed with patient to follow up with dermatologist as scheduled. Monitor for signs of bacterial infection such as spreading erythema,  increased warmth, to follow up for reevaluation.   Final Clinical Impressions(s) / UC Diagnoses   Final diagnoses:  Medication refill    New Prescriptions Discharge Medication List as of 03/21/2017  5:40 PM        Belinda Fisher, PA-C 03/21/17 1749
